# Patient Record
Sex: Male | Born: 1990 | Race: Black or African American | Hispanic: No | Marital: Married | Smoking: Current every day smoker
Health system: Southern US, Community
[De-identification: ages and names within clinical notes are randomized; demographics above are authoritative.]

## PROBLEM LIST (undated history)

## (undated) DIAGNOSIS — L0591 Pilonidal cyst without abscess: Secondary | ICD-10-CM

## (undated) HISTORY — PX: OTHER SURGICAL HISTORY: SHX169

---

## 2010-03-02 ENCOUNTER — Emergency Department (HOSPITAL_COMMUNITY)
Admission: EM | Admit: 2010-03-02 | Discharge: 2010-03-02 | Payer: Self-pay | Source: Home / Self Care | Admitting: Emergency Medicine

## 2010-03-12 ENCOUNTER — Emergency Department (HOSPITAL_COMMUNITY): Payer: Self-pay

## 2010-03-12 ENCOUNTER — Emergency Department (HOSPITAL_COMMUNITY)
Admission: EM | Admit: 2010-03-12 | Discharge: 2010-03-12 | Disposition: A | Discharge status: Home / Self Care | Payer: No Typology Code available for payment source | Attending: Emergency Medicine | Admitting: Emergency Medicine

## 2010-03-12 DIAGNOSIS — S12600A Unspecified displaced fracture of seventh cervical vertebra, initial encounter for closed fracture: Secondary | ICD-10-CM | POA: Insufficient documentation

## 2010-03-12 DIAGNOSIS — M542 Cervicalgia: Secondary | ICD-10-CM | POA: Insufficient documentation
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
## 2014-02-06 ENCOUNTER — Encounter (HOSPITAL_BASED_OUTPATIENT_CLINIC_OR_DEPARTMENT_OTHER): Payer: Self-pay | Admitting: *Deleted

## 2014-02-06 ENCOUNTER — Emergency Department (HOSPITAL_BASED_OUTPATIENT_CLINIC_OR_DEPARTMENT_OTHER)
Admission: EM | Admit: 2014-02-06 | Discharge: 2014-02-06 | Disposition: A | Discharge status: Home / Self Care | Payer: Self-pay | Attending: Emergency Medicine | Admitting: Emergency Medicine

## 2014-02-06 ENCOUNTER — Emergency Department (HOSPITAL_BASED_OUTPATIENT_CLINIC_OR_DEPARTMENT_OTHER): Payer: Self-pay

## 2014-02-06 DIAGNOSIS — R05 Cough: Secondary | ICD-10-CM

## 2014-02-06 DIAGNOSIS — J40 Bronchitis, not specified as acute or chronic: Secondary | ICD-10-CM

## 2014-02-06 DIAGNOSIS — J209 Acute bronchitis, unspecified: Secondary | ICD-10-CM | POA: Insufficient documentation

## 2014-02-06 DIAGNOSIS — R059 Cough, unspecified: Secondary | ICD-10-CM

## 2014-02-06 DIAGNOSIS — Z720 Tobacco use: Secondary | ICD-10-CM | POA: Insufficient documentation

## 2014-02-06 MED ORDER — AZITHROMYCIN 250 MG PO TABS: 250.0000 mg | ORAL_TABLET | Freq: Every day | ORAL | Status: DC

## 2014-02-06 MED ORDER — AZITHROMYCIN 250 MG PO TABS
500.0000 mg | ORAL_TABLET | Freq: Once | ORAL | Status: AC
  Administered 2014-02-06: 500 mg via ORAL
  Filled 2014-02-06: qty 2

## 2014-02-06 NOTE — ED Notes (Addendum)
Congestion, headache, cough, facial pain. States that his grandmother and mother have the same.Marland Kitchen

## 2014-02-06 NOTE — ED Provider Notes (Signed)
CSN: [id number]     Arrival date & time 02/06/14  1533 History   This chart was scribed for Nelia Shi, MD by Milly Jakob, ED Scribe. The patient was seen in room MH12/MH12. Patient's care was started at 4:27 PM.     Chief Complaint  Patient presents with  . URI   The history is provided by the patient. No language interpreter was used.   HPI Comments: Jon Gregory is a 24 y.o. male who presents to the Emergency Department complaining of constant URI symptoms for the past 6 days. He reports a persistent, productive cough, subjective fever, headache, congestion, and facial pain. He is a current every day smoker, but he has not been smoking while he has been sick.  He reports spending time with his mother and grandmother who have been sick with similar symptoms. He denies any additional medical problems.   History reviewed. No pertinent past medical history. History reviewed. No pertinent past surgical history. No family history on file. History  Substance Use Topics  . Smoking status: Current Every Day Smoker  . Smokeless tobacco: Not on file  . Alcohol Use: Not on file    Review of Systems  Constitutional: Positive for fever (subjective) and chills.  Respiratory: Positive for cough.   Gastrointestinal: Negative for nausea, vomiting and diarrhea.  Neurological: Positive for headaches.  All other systems reviewed and are negative.  Allergies  Review of patient's allergies indicates no known allergies.  Home Medications   Prior to Admission medications   Medication Sig Start Date End Date Taking? Authorizing Provider  azithromycin (ZITHROMAX Z-PAK) 250 MG tablet Take 1 tablet (250 mg total) by mouth daily. 02/06/14   Nelia Shi, MD   BP 158/87 mmHg  Pulse 84  Temp(Src) 98.5 F (36.9 C) (Oral)  Resp 20  Ht  (1.727 m)  Wt 215 lb (97.523 kg)  BMI 32.70 kg/m2  SpO2 100% Physical Exam Physical Exam  Nursing note and vitals reviewed. Constitutional: He is  oriented to person, place, and time. He appears well-developed and well-nourished. No distress.  HENT:  Head: Normocephalic and atraumatic.  Eyes: Pupils are equal, round, and reactive to light.  Neck: Normal range of motion.  Cardiovascular: Normal rate and intact distal pulses.   Pulmonary/Chest: No respiratory distress.  no wheezes.  No use of accessory muscles. Abdominal: Normal appearance. He exhibits no distension.  Musculoskeletal: Normal range of motion.  Neurological: He is alert and oriented to person, place, and time. No cranial nerve deficit.  Skin: Skin is warm and dry. No rash noted.  Psychiatric: He has a normal mood and affect. His behavior is normal.   ED Course  Procedures (including critical care time) Medications  azithromycin (ZITHROMAX) tablet 500 mg (not administered)    DIAGNOSTIC STUDIES: Oxygen Saturation is 100% on room air, normal by my interpretation.    COORDINATION OF CARE: 4:31 PM-Discussed treatment plan which includes CXR with pt at bedside and pt agreed to plan.   Labs Review Labs Reviewed - No data to display  Imaging Review Dg Chest 2 View  02/06/2014   CLINICAL DATA:  Five-day history of cough and congestion.  EXAM: CHEST  2 VIEW  COMPARISON:  None.  FINDINGS: The cardiac silhouette, mediastinal and hilar contours are normal. There is peribronchial thickening and increased interstitial markings which could reflect smoking changes or bronchitis. No infiltrates or effusions. The bony thorax is intact.  IMPRESSION: Bronchitic lung changes could be related to  smoking or bronchitis. No infiltrates.   Electronically Signed   By: Loralie Champagne M.D.   On: 02/06/2014 16:41      MDM   Final diagnoses:  Cough  Bronchitis     I personally performed the services described in this documentation, which was scribed in my presence. The recorded information has been reviewed and considered.      Nelia Shi, MD 02/06/14 [phone]

## 2014-02-06 NOTE — Discharge Instructions (Signed)

## 2014-06-08 ENCOUNTER — Emergency Department (HOSPITAL_BASED_OUTPATIENT_CLINIC_OR_DEPARTMENT_OTHER)
Admission: EM | Admit: 2014-06-08 | Discharge: 2014-06-08 | Disposition: A | Discharge status: Home / Self Care | Payer: Self-pay | Attending: Emergency Medicine | Admitting: Emergency Medicine

## 2014-06-08 ENCOUNTER — Encounter (HOSPITAL_BASED_OUTPATIENT_CLINIC_OR_DEPARTMENT_OTHER): Payer: Self-pay | Admitting: *Deleted

## 2014-06-08 DIAGNOSIS — Z720 Tobacco use: Secondary | ICD-10-CM | POA: Insufficient documentation

## 2014-06-08 DIAGNOSIS — L0501 Pilonidal cyst with abscess: Secondary | ICD-10-CM | POA: Insufficient documentation

## 2014-06-08 LAB — CBG MONITORING, ED: GLUCOSE-CAPILLARY: 87 mg/dL (ref 70–99)

## 2014-06-08 MED ORDER — CEPHALEXIN 500 MG PO CAPS: 500.0000 mg | ORAL_CAPSULE | Freq: Four times a day (QID) | ORAL | Status: DC

## 2014-06-08 MED ORDER — HYDROCODONE-ACETAMINOPHEN 5-325 MG PO TABS: 1.0000 | ORAL_TABLET | ORAL | Status: DC | PRN

## 2014-06-08 MED ORDER — LIDOCAINE-EPINEPHRINE (PF) 2 %-1:200000 IJ SOLN
10.0000 mL | Freq: Once | INTRAMUSCULAR | Status: AC
  Administered 2014-06-08: 10 mL
  Filled 2014-06-08: qty 10

## 2014-06-08 NOTE — Discharge Instructions (Signed)
Incision and Drainage Incision and drainage is a procedure in which a sac-like structure (cystic structure) is opened and drained. The area to be drained usually contains material such as pus, fluid, or blood.  LET YOUR CAREGIVER KNOW ABOUT:   Allergies to medicine.  Medicines taken, including vitamins, herbs, eyedrops, over-the-counter medicines, and creams.  Use of steroids (by mouth or creams).  Previous problems with anesthetics or numbing medicines.  History of bleeding problems or blood clots.  Previous surgery.  Other health problems, including diabetes and kidney problems.  Possibility of pregnancy, if this applies. RISKS AND COMPLICATIONS  Pain.  Bleeding.  Scarring.  Infection. BEFORE THE PROCEDURE  You may need to have an ultrasound or other imaging tests to see how large or deep your cystic structure is. Blood tests may also be used to determine if you have an infection or how severe the infection is. You may need to have a tetanus shot. PROCEDURE  The affected area is cleaned with a cleaning fluid. The cyst area will then be numbed with a medicine (local anesthetic). A small incision will be made in the cystic structure. A syringe or catheter may be used to drain the contents of the cystic structure, or the contents may be squeezed out. The area will then be flushed with a cleansing solution. After cleansing the area, it is often gently packed with a gauze or another wound dressing. Once it is packed, it will be covered with gauze and tape or some other type of wound dressing. AFTER THE PROCEDURE   Often, you will be allowed to go home right after the procedure.  You may be given antibiotic medicine to prevent or heal an infection.  If the area was packed with gauze or some other wound dressing, you will likely need to come back in 1 to 2 days to get it removed.  The area should heal in about 14 days. Document Released: 07/17/2000 Document Revised: 07/23/2011  Document Reviewed: 03/18/2011 [facility] CenterExitCare Patient Information 2015 [place]Exit[place], [place]. This information is not intended to replace advice given to you by your health care provider. Make sure you discuss any questions you have with your health care provider.  Abscess An abscess is an infected area that contains a collection of pus and debris.It can occur in almost any part of the body. An abscess is also known as a furuncle or boil. CAUSES  An abscess occurs when tissue gets infected. This can occur from blockage of oil or sweat glands, infection of hair follicles, or a minor injury to the skin. As the body tries to fight the infection, pus collects in the area and creates pressure under the skin. This pressure causes pain. People with weakened immune systems have difficulty fighting infections and get certain abscesses more often.  SYMPTOMS Usually an abscess develops on the skin and becomes a painful mass that is red, warm, and tender. If the abscess forms under the skin, you may feel a moveable soft area under the skin. Some abscesses break open (rupture) on their own, but most will continue to get worse without care. The infection can spread deeper into the body and eventually into the bloodstream, causing you to feel ill.  DIAGNOSIS  Your caregiver will take your medical history and perform a physical exam. A sample of fluid may also be taken from the abscess to determine what is causing your infection. TREATMENT  Your caregiver may prescribe antibiotic medicines to fight the infection. However, taking antibiotics alone usually  does not cure an abscess. Your caregiver may need to make a small cut (incision) in the abscess to drain the pus. In some cases, gauze is packed into the abscess to reduce pain and to continue draining the area. HOME CARE INSTRUCTIONS   Only take over-the-counter or prescription medicines for pain, discomfort, or fever as directed by your caregiver.  If you were prescribed  antibiotics, take them as directed. Finish them even if you start to feel better.  If gauze is used, follow your caregiver's directions for changing the gauze.  To avoid spreading the infection:  Keep your draining abscess covered with a bandage.  Wash your hands well.  Do not share personal care items, towels, or whirlpools with others.  Avoid skin contact with others.  Keep your skin and clothes clean around the abscess.  Keep all follow-up appointments as directed by your caregiver. SEEK MEDICAL CARE IF:   You have increased pain, swelling, redness, fluid drainage, or bleeding.  You have muscle aches, chills, or a general ill feeling.  You have a fever. MAKE SURE YOU:   Understand these instructions.  Will watch your condition.  Will get help right away if you are not doing well or get worse. Document Released: 10/31/2004 Document Revised: 07/23/2011 Document Reviewed: 04/05/2011 [facility] LLCExitCare Patient Information 2015 [place]ExitCare, [place]LL[place]. This information is not intended to replace advice given to you by your health care provider. Make sure you discuss any questions you have with your health care provider.  Pilonidal Cyst A pilonidal cyst occurs when hairs get trapped (ingrown) beneath the skin in the crease between the buttocks over your sacrum (the bone under that crease). Pilonidal cysts are most common in young men with a lot of body hair. When the cyst is ruptured (breaks) or leaking, fluid from the cyst may cause burning and itching. If the cyst becomes infected, it causes a painful swelling filled with pus (abscess). The pus and trapped hairs need to be removed (often by lancing) so that the infection can heal. However, recurrence is common and an operation may be needed to remove the cyst. HOME CARE INSTRUCTIONS   If the cyst was NOT INFECTED:  Keep the area clean and dry. Bathe or shower daily. Wash the area well with a germ-killing soap. Warm tub baths may help prevent  infection and help with drainage. Dry the area well with a towel.  Avoid tight clothing to keep area as moisture free as possible.  Keep area between buttocks as free of hair as possible. A depilatory may be used.  If the cyst WAS INFECTED and needed to be drained:  Your caregiver packed the wound with gauze to keep the wound open. This allows the wound to heal from the inside outwards and continue draining.  Return for a wound check in 1 day or as suggested.  If you take tub baths or showers, repack the wound with gauze following them. Sponge baths (at the sink) are a good alternative.  If an antibiotic was ordered to fight the infection, take as directed.  Only take over-the-counter or prescription medicines for pain, discomfort, or fever as directed by your caregiver.  After the drain is removed, use sitz baths for 20 minutes 4 times per day. Clean the wound gently with mild unscented soap, pat dry, and then apply a dry dressing. SEEK MEDICAL CARE IF:   You have increased pain, swelling, redness, drainage, or bleeding from the area.  You have a fever.  You have  muscles aches, dizziness, or a general ill feeling. Document Released: 01/19/2000 Document Revised: 04/15/2011 Document Reviewed: 03/18/2008 [facility] CenterExitCare Patient Information 2015 [place]ExitCare, [place]LLC. This information is not intended to replace advice given to you by your health care provider. Make sure you discuss any questions you have with your health care provider.

## 2014-06-08 NOTE — ED Notes (Signed)
Pt c/o abscess to sacral area x 3 days

## 2014-06-08 NOTE — ED Provider Notes (Signed)
CSN: [id number]     Arrival date & time 06/08/14  1734 History   First MD Initiated Contact with Patient 06/08/14 1844     Chief Complaint  Patient presents with  . Abscess     (Consider location/radiation/quality/duration/timing/severity/associated sxs/prior Treatment) HPI    PCP: No primary care provider on file. Blood pressure 140/74, pulse 90, temperature 99.8 F (37.7 C), temperature source Oral, resp. rate 16, height 5\' 8"  (1.727 m), weight 220 lb (99.791 kg), SpO2 100 %.  Jon Gregory is a 24 y.o.male with a significant PMH of no PMH presents to the ER with complaints of abscess to upper gluteal fold. Patient says symptoms started 3 days ago with pain and then he noticed redness and swelling to the area. He reports trying to soak it and having his wife attempt to pop it. He feels like this made it worse. He has not had any drainage from the area. He denies any nausea, vomiting, diarrhea, fevers, dysuria, abdominal pain, neck pain, weakness.    History reviewed. No pertinent past medical history. History reviewed. No pertinent past surgical history. History reviewed. No pertinent family history. History  Substance Use Topics  . Smoking status: Current Every Day Smoker -- 1.00 packs/day    Types: Cigarettes  . Smokeless tobacco: Not on file  . Alcohol Use: No    Review of Systems  10 Systems reviewed and are negative for acute change except as noted in the HPI.    Allergies  Review of patient's allergies indicates no known allergies.  Home Medications   Prior to Admission medications   Medication Sig Start Date End Date Taking? Authorizing Provider  cephALEXin (KEFLEX) 500 MG capsule Take 1 capsule (500 mg total) by mouth 4 (four) times daily. 06/08/14   Dayle Sherpa Neva SeatGreene, PA-C   BP 140/74 mmHg  Pulse 90  Temp(Src) 99.8 F (37.7 C) (Oral)  Resp 16  Ht 5\' 8"  (1.727 m)  Wt 220 lb (99.791 kg)  BMI 33.46 kg/m2  SpO2 100% Physical Exam  Constitutional: He appears  well-developed and well-nourished. No distress.  HENT:  Head: Normocephalic and atraumatic.  Eyes: Pupils are equal, round, and reactive to light.  Neck: Normal range of motion. Neck supple.  Cardiovascular: Normal rate and regular rhythm.   Pulmonary/Chest: Effort normal.  Abdominal: Soft. Bowel sounds are normal. There is no tenderness. There is no rigidity, no rebound and no guarding.  Genitourinary: Rectum normal.     Large pilonidal cyst to upper gluteal fold. It is tender with surrounded erythema and induration. This does not involve the rectum, perineum or testicles.  Neurological: He is alert.  Skin: Skin is warm and dry.  Nursing note and vitals reviewed.   ED Course  Procedures (including critical care time) Labs Review Labs Reviewed  CBG MONITORING, ED    Imaging Review No results found.   EKG Interpretation None      MDM   Final diagnoses:  Pilonidal cyst with abscess    INCISION AND DRAINAGE Performed by: Dorthula MatasGREENE,Maddox Bratcher G Consent: Verbal consent obtained. Risks and benefits: risks, benefits and alternatives were discussed Type: abscess  Body area: pilonidal cyst  Anesthesia: local infiltration  Incision was made with a scalpel.  Local anesthetic: lidocaine 2% with epinephrine  Anesthetic total: 2 ml  Complexity: complex Blunt dissection to break up loculations  Drainage: purulent  Drainage amount: large  Packing material: 1/4 in iodoform gauze  Patient tolerance: Patient tolerated the procedure well with no immediate complications.  Pt advised to return in two days to have packing removed and wound re-evaluated Will start on keflex and Rx pain medication. Will need work note. No systemic symptoms of F/n/v/d or abdominal pains.  24 y.o.Jon Gregory's evaluation in the Emergency Department is complete. It has been determined that no acute conditions requiring further emergency intervention are present at this time. The  patient/guardian have been advised of the diagnosis and plan. We have discussed signs and symptoms that warrant return to the ED, such as changes or worsening in symptoms.  Vital signs are stable at discharge. Filed Vitals:   06/08/14 1745  BP: 140/74  Pulse: 90  Temp: 99.8 F (37.7 C)  Resp: 16    Patient/guardian has voiced understanding and agreed to follow-up with the PCP or specialist.    Marlon Peliffany Karey Stucki, PA-C 06/08/14 2030  Jerelyn ScottMartha Linker, MD 06/08/14 2032

## 2014-06-08 NOTE — ED Notes (Signed)
rx x 12 given for hydrocodone and keflex- d/c with ride

## 2014-06-08 NOTE — ED Notes (Signed)
PA at bedside.

## 2014-06-10 ENCOUNTER — Emergency Department (HOSPITAL_BASED_OUTPATIENT_CLINIC_OR_DEPARTMENT_OTHER)
Admission: EM | Admit: 2014-06-10 | Discharge: 2014-06-10 | Disposition: A | Discharge status: Home / Self Care | Payer: Self-pay | Attending: Emergency Medicine | Admitting: Emergency Medicine

## 2014-06-10 ENCOUNTER — Encounter (HOSPITAL_BASED_OUTPATIENT_CLINIC_OR_DEPARTMENT_OTHER): Payer: Self-pay | Admitting: Emergency Medicine

## 2014-06-10 DIAGNOSIS — Z4801 Encounter for change or removal of surgical wound dressing: Secondary | ICD-10-CM | POA: Insufficient documentation

## 2014-06-10 DIAGNOSIS — Z09 Encounter for follow-up examination after completed treatment for conditions other than malignant neoplasm: Secondary | ICD-10-CM

## 2014-06-10 DIAGNOSIS — Z872 Personal history of diseases of the skin and subcutaneous tissue: Secondary | ICD-10-CM | POA: Insufficient documentation

## 2014-06-10 DIAGNOSIS — Z792 Long term (current) use of antibiotics: Secondary | ICD-10-CM | POA: Insufficient documentation

## 2014-06-10 DIAGNOSIS — Z720 Tobacco use: Secondary | ICD-10-CM | POA: Insufficient documentation

## 2014-06-10 HISTORY — DX: Pilonidal cyst without abscess: L05.91

## 2014-06-10 NOTE — ED Notes (Signed)
I applied 2x2's folded and placed onto surface of wound, then applied 4x4's and covered with Medipore cloth tape. I showed patient how to change same, and gave him additional wound care supplies for home use.

## 2014-06-10 NOTE — Discharge Instructions (Signed)
Sitz baths several times daily for the next 2-3 days.  Perform dressing changes with clean gauze and bacitracin several times daily.  Return to the emergency department for worsening pain, increased drainage, bleeding, or other new and concerning symptoms.   Incision Care  An incision is a surgical cut to open your skin. You need to take care of your incision. This helps you to not get an infection. HOME CARE  Only take medicine as told by your doctor.  Do not take off your bandage (dressing) or get your incision wet until your doctor approves. Change the bandage and call your doctor if the bandage gets wet, dirty, or starts to smell.  Take showers. Do not take baths, swim, or do anything that may soak your incision until it heals.  Return to your normal diet and activities as told or allowed by your doctor.  Avoid lifting any weight until your doctor approves.  Put medicine that helps lessen itching on your incision as told by your doctor. Do not pick or scratch at your incision.  Keep your doctor visit to have your stitches (sutures) or staples removed.  Drink enough fluids to keep your pee (urine) clear or pale yellow. GET HELP RIGHT AWAY IF:  You have a fever.  You have a rash.  You are dizzy, or you pass out (faint) while standing.  You have trouble breathing.  You have a reaction or side effects to medicine given to you.  You have redness, puffiness (swelling), or more pain in the incision and medicine does not help.  You have fluid, blood, or yellowish-white fluid (pus) coming from the incision lasting over 1 day.  You have muscle aches, chills, or you feel sick.  You have a bad smell coming from the incision or bandage.  Your incision opens up after stitches, staples, or sticky strips have been removed.  You keep feeling sick to your stomach (nauseous) or keep throwing up (vomiting). MAKE SURE YOU:   Understand these instructions.  Will watch your  condition.  Will get help right away if you are not doing well or get worse. Document Released: 04/15/2011 Document Reviewed: 03/17/2013 [facility] CenterExitCare Patient Information 2015 [place]villeExitCare, MarylandLLC. This information is not intended to replace advice given to you by your health care provider. Make sure you discuss any questions you have with your health care provider.

## 2014-06-10 NOTE — ED Provider Notes (Signed)
CSN: [id number]     Arrival date & time 06/10/14  2200 History  This chart was scribed for Geoffery Lyonsouglas Thatcher Doberstein, MD by Chestine SporeSoijett Blue, ED Scribe. The patient was seen in room MH09/MH09 at 11:14 PM.     Chief Complaint  Patient presents with  . Wound Check      Patient is a 24 y.o. male presenting with wound check. The history is provided by the patient. No language interpreter was used.  Wound Check This is a new problem. The current episode started 2 days ago.    HPI Comments: Jon Gregory is a 24 y.o. male who presents to the Emergency Department complaining of wound check onset today. Pt had an abscess I&D and he had it stuffed with gauze. Pt was informed to come back if the area was feeling worse. Pt notes that the abscess is at the small of his back and he still had packing placed. Pt had to change his packing several times a day because it would become soaked. This is the pt first time having an abscess. Pt was given Keflex for his treatment. He denies any other symptoms.   Past Medical History  Diagnosis Date  . Pilonidal cyst    Past Surgical History  Procedure Laterality Date  . I&d pilonidal cyst     No family history on file. History  Substance Use Topics  . Smoking status: Current Every Day Smoker -- 1.00 packs/day    Types: Cigarettes  . Smokeless tobacco: Not on file  . Alcohol Use: No    Review of Systems  Constitutional: Negative for fever and chills.  Genitourinary: Negative for dysuria and hematuria.  Musculoskeletal: Negative for myalgias, joint swelling and arthralgias.  Skin: Positive for wound. Negative for color change and rash.    A complete 10 system review of systems was obtained and all systems are negative except as noted in the HPI and PMH.    Allergies  Review of patient's allergies indicates no known allergies.  Home Medications   Prior to Admission medications   Medication Sig Start Date End Date Taking? Authorizing Provider  cephALEXin  (KEFLEX) 500 MG capsule Take 1 capsule (500 mg total) by mouth 4 (four) times daily. 06/08/14   Marlon Peliffany Greene, PA-C  HYDROcodone-acetaminophen (NORCO/VICODIN) 5-325 MG per tablet Take 1 tablet by mouth every 4 (four) hours as needed. 06/08/14   Tiffany Neva SeatGreene, PA-C   BP 158/80 mmHg  Pulse 99  Temp(Src) 98.3 F (36.8 C) (Oral)  Resp 20  Ht 5\' 8"  (1.727 m)  Wt 220 lb (99.791 kg)  BMI 33.46 kg/m2  SpO2 100%  Physical Exam  Constitutional: He is oriented to person, place, and time. He appears well-developed and well-nourished. No distress.  HENT:  Head: Normocephalic and atraumatic.  Mouth/Throat: No oropharyngeal exudate.  Eyes: Pupils are equal, round, and reactive to light.  Neck: Normal range of motion. Neck supple.  Cardiovascular: Normal rate, regular rhythm and normal heart sounds.  Exam reveals no gallop and no friction rub.   No murmur heard. Pulmonary/Chest: Effort normal and breath sounds normal. No respiratory distress. He has no wheezes. He has no rales.  Abdominal: Soft. Bowel sounds are normal. He exhibits no distension and no mass. There is no tenderness. There is no rebound and no guarding.  Musculoskeletal: Normal range of motion. He exhibits no edema or tenderness.  Neurological: He is alert and oriented to person, place, and time.  Skin: Skin is warm and dry.  Recently drained  pilonidal cyst with packing in place. No significant erythema or warmth. Packing removed with minimal residual drainage.   Psychiatric: He has a normal mood and affect.    ED Course  Procedures (including critical care time) DIAGNOSTIC STUDIES: Oxygen Saturation is 100% on RA, nl by my interpretation.    COORDINATION OF CARE: 11:18 PM-Discussed treatment plan which includes sitz bath with pt at bedside and pt agreed to plan.   Labs Review Labs Reviewed - No data to display  Imaging Review No results found.   EKG Interpretation None      MDM   Final diagnoses:  None    Wound  inspected. Packing removed. Drainage is minimal and the wound appears to be healing properly. Will recommend sitz baths, continued wound care, and when necessary return.  I personally performed the services described in this documentation, which was scribed in my presence. The recorded information has been reviewed and is accurate.      Geoffery Lyonsouglas Ibrohim Simmers, MD 06/11/14 [phone]

## 2014-06-10 NOTE — ED Notes (Signed)
Recheck of abscess 

## 2014-10-07 ENCOUNTER — Encounter (HOSPITAL_BASED_OUTPATIENT_CLINIC_OR_DEPARTMENT_OTHER): Payer: Self-pay | Admitting: Emergency Medicine

## 2014-10-07 ENCOUNTER — Emergency Department (HOSPITAL_BASED_OUTPATIENT_CLINIC_OR_DEPARTMENT_OTHER)
Admission: EM | Admit: 2014-10-07 | Discharge: 2014-10-07 | Disposition: A | Discharge status: Home / Self Care | Payer: Self-pay | Attending: Emergency Medicine | Admitting: Emergency Medicine

## 2014-10-07 DIAGNOSIS — Z792 Long term (current) use of antibiotics: Secondary | ICD-10-CM | POA: Insufficient documentation

## 2014-10-07 DIAGNOSIS — Z720 Tobacco use: Secondary | ICD-10-CM | POA: Insufficient documentation

## 2014-10-07 DIAGNOSIS — L0501 Pilonidal cyst with abscess: Secondary | ICD-10-CM

## 2014-10-07 MED ORDER — CEPHALEXIN 500 MG PO CAPS: 500.0000 mg | ORAL_CAPSULE | Freq: Four times a day (QID) | ORAL | Status: DC

## 2014-10-07 MED ORDER — LIDOCAINE-EPINEPHRINE (PF) 2 %-1:200000 IJ SOLN
20.0000 mL | Freq: Once | INTRAMUSCULAR | Status: DC
  Filled 2014-10-07: qty 20

## 2014-10-07 MED ORDER — HYDROCODONE-ACETAMINOPHEN 5-325 MG PO TABS
1.0000 | ORAL_TABLET | Freq: Once | ORAL | Status: AC
  Administered 2014-10-07: 1 via ORAL
  Filled 2014-10-07: qty 1

## 2014-10-07 MED ORDER — HYDROCODONE-ACETAMINOPHEN 5-325 MG PO TABS: 2.0000 | ORAL_TABLET | ORAL | Status: DC | PRN

## 2014-10-07 NOTE — ED Provider Notes (Signed)
CSN: [id number]     Arrival date & time 10/07/14  1902 History   First MD Initiated Contact with Patient 10/07/14 1921     Chief Complaint  Patient presents with  . Recurrent Skin Infections     (Consider location/radiation/quality/duration/timing/severity/associated sxs/prior Treatment) HPI Comments: Jon Gregory is a 24 y.o m with a pmhx of recurrent pilonidal cyst who presents to the Emergency Department today c/o abscess on bottom. Pt states that he has "had it drained before and I think I need it again." Pt states that he has 9/10 pain. Denies fever, chills, n/v/d, numbness/tingling, CP, SOB.   The history is provided by the patient.    Past Medical History  Diagnosis Date  . Pilonidal cyst    Past Surgical History  Procedure Laterality Date  . I&d pilonidal cyst     No family history on file. Social History  Substance Use Topics  . Smoking status: Current Every Day Smoker -- 1.00 packs/day    Types: Cigarettes  . Smokeless tobacco: None  . Alcohol Use: No    Review of Systems  All other systems reviewed and are negative.     Allergies  Review of patient's allergies indicates no known allergies.  Home Medications   Prior to Admission medications   Medication Sig Start Date End Date Taking? Authorizing Provider  HYDROcodone-acetaminophen (NORCO/VICODIN) 5-325 MG per tablet Take 1 tablet by mouth every 4 (four) hours as needed. 06/08/14  Yes Tiffany Neva Seat, PA-C  cephALEXin (KEFLEX) 500 MG capsule Take 1 capsule (500 mg total) by mouth 4 (four) times daily. 06/08/14   Tiffany Neva Seat, PA-C  cephALEXin (KEFLEX) 500 MG capsule Take 1 capsule (500 mg total) by mouth 4 (four) times daily. 10/07/14   Braeden Dolinski Tripp Maayan Jenning, PA-C  HYDROcodone-acetaminophen (NORCO/VICODIN) 5-325 MG per tablet Take 2 tablets by mouth every 4 (four) hours as needed. 10/07/14   Tonye Tancredi Tripp Kaveri Perras, PA-C   BP 122/76 mmHg  Pulse 70  Temp(Src) 99.2 F (37.3 C) (Oral)  Resp 18  Ht 5\' 8"  (1.727  m)  Wt 238 lb (107.956 kg)  BMI 36.20 kg/m2  SpO2 100% Physical Exam  Constitutional: He is oriented to person, place, and time. He appears well-developed and well-nourished.  HENT:  Head: Normocephalic and atraumatic.  Eyes: Conjunctivae are normal. Right eye exhibits no discharge. Left eye exhibits no discharge. No scleral icterus.  Cardiovascular: Normal rate.   Pulmonary/Chest: Effort normal.  Abdominal: Soft.  Lymphadenopathy:    He has no cervical adenopathy.  Neurological: He is alert and oriented to person, place, and time. Coordination normal.  Skin: Skin is warm and dry. No rash noted. No pallor.  2 cm fluctuant mass at apex of gluteal crest. Indurated tissue surrounding abscess. Area not warm to touch. No erythema.   Psychiatric: He has a normal mood and affect. His behavior is normal.  Nursing note and vitals reviewed.   ED Course  Procedures (including critical care time)  INCISION AND DRAINAGE Performed by: Dub Mikes Consent: Verbal consent obtained. Risks and benefits: risks, benefits and alternatives were discussed Type: abscess  Body area: apex of gluteal cleft  Anesthesia: local infiltration  Incision was made with a scalpel.  Local anesthetic: lidocaine 2%  w/ epinephrine  Anesthetic total: 10 ml  Complexity: complex Blunt dissection to break up loculations  Drainage: purulent  Drainage amount: copious  Packing material: 1/4 in iodoform gauze  Patient tolerance: Patient tolerated the procedure well with no immediate complications.  Labs Review Labs Reviewed - No data to display  Imaging Review No results found. I have personally reviewed and evaluated these images and lab results as part of my medical decision-making.   EKG Interpretation None      MDM   Final diagnoses:  Pilonidal cyst with abscess    Pt seen for recurrent pilonidal cyst requiring incision and drainage. No evidence of cellulitis. Area was  packed. Return in 2 days for wound re-check. Will give keflex. Recommend surgery follow up for pilonidal cyst removal. Return precautions outlined in patient discharge instructions.      Lester Kinsman Murdock, PA-C 10/07/14 2302  Elwin Mocha, MD 10/07/14 [phone]

## 2014-10-07 NOTE — Discharge Instructions (Signed)
Pilonidal Cyst, Care After A pilonidal cyst occurs when hairs get trapped (ingrown) beneath the skin in the crease between the buttocks over your sacrum (the bone under that crease). Pilonidal cysts are most common in young men with a lot of body hair. When the cyst breaks(ruptured) or leaks, fluid from the cyst may cause burning and itching. If the cyst becomes infected, it causes a painful swelling filled with pus (abscess). The pus and trapped hairs need to be removed (often by lancing) so that the infection can heal. The word pilonidal means hair nest. HOME CARE INSTRUCTIONS If the pilonidal sinus was NOT DRAINING OR LANCED:  Keep the area clean and dry. Bathe or shower daily. Wash the area well with a germ-killing soap. Hot tub baths may help prevent infection. Dry the area well with a towel.  Avoid tight clothing in order to keep area as moisture-free as possible.  Keep area between buttocks as free from hair as possible. A depilatory may be used.  Take antibiotics as directed.  Only take over-the-counter or prescription medicines for pain, discomfort, or fever as directed by your caregiver. If the cyst WAS INFECTED AND NEEDED TO BE DRAINED:  Your caregiver may have packed the wound with gauze to keep the wound open. This allows the wound to heal from the inside outward and continue to drain.  Return as directed for a wound check.  If you take tub baths or showers, repack the wound with gauze as directed following. Sponge baths are a good alternative. Sitz baths may be used three to four times a day or as directed.  If an antibiotic was ordered to fight the infection, take as directed.  Only take over-the-counter or prescription medicines for pain, discomfort, or fever as directed by your caregiver.  If a drain was in place and removed, use sitz baths for 20 minutes 4 times per day. Clean the wound gently with mild unscented soap, pat dry, and then apply a dry dressing as  directed. If you had surgery and IT WAS MARSUPIALIZED (LEFT OPEN):  Your wound was packed with gauze to keep the wound open. This allows the wound to heal from the inside outwards and continue draining. The changing of the dressing regularly also helps keep the wound clean.  Return as directed for a wound check.  If you take tub baths or showers, repack the wound with gauze as directed following. Sponge baths are a good alternative. Sitz baths can also be used. This may be done three to four times a day or as directed.  If an antibiotic was ordered to fight the infection, take as directed.  Only take over-the-counter or prescription medicines for pain, discomfort, or fever as directed by your caregiver.  If you had surgery and the wound was closed you may care for it as directed. This generally includes keeping it dry and clean and dressing it as directed. SEEK MEDICAL CARE IF:   You have increased pain, swelling, redness, drainage, or bleeding from the area.  You have a fever.  You have muscles aches, dizziness, or a general ill feeling. Document Released: 02/21/2006 Document Revised: 09/23/2012 Document Reviewed: 05/08/2006 [facility] Patient Information 2015 [place], [place]. This information is not intended to replace advice given to you by your health care provider. Make sure you discuss any questions you have with your health care provider.  Pilonidal Cyst A pilonidal cyst occurs when hairs get trapped (ingrown) beneath the skin in the crease between the buttocks over  your sacrum (the bone under that crease). Pilonidal cysts are most common in young men with a lot of body hair. When the cyst is ruptured (breaks) or leaking, fluid from the cyst may cause burning and itching. If the cyst becomes infected, it causes a painful swelling filled with pus (abscess). The pus and trapped hairs need to be removed (often by lancing) so that the infection can heal. However, recurrence is common and an  operation may be needed to remove the cyst. HOME CARE INSTRUCTIONS   If the cyst was NOT INFECTED:  Keep the area clean and dry. Bathe or shower daily. Wash the area well with a germ-killing soap. Warm tub baths may help prevent infection and help with drainage. Dry the area well with a towel.  Avoid tight clothing to keep area as moisture free as possible.  Keep area between buttocks as free of hair as possible. A depilatory may be used.  If the cyst WAS INFECTED and needed to be drained:  Your caregiver packed the wound with gauze to keep the wound open. This allows the wound to heal from the inside outwards and continue draining.  Return for a wound check in 1 day or as suggested.  If you take tub baths or showers, repack the wound with gauze following them. Sponge baths (at the sink) are a good alternative.  If an antibiotic was ordered to fight the infection, take as directed.  Only take over-the-counter or prescription medicines for pain, discomfort, or fever as directed by your caregiver.  After the drain is removed, use sitz baths for 20 minutes 4 times per day. Clean the wound gently with mild unscented soap, pat dry, and then apply a dry dressing. SEEK MEDICAL CARE IF:   You have increased pain, swelling, redness, drainage, or bleeding from the area.  You have a fever.  You have muscles aches, dizziness, or a general ill feeling. Document Released: 01/19/2000 Document Revised: 04/15/2011 Document Reviewed: 03/18/2008 [facility] Patient Information 2015 [place], [place]. This information is not intended to replace advice given to you by your health care provider. Make sure you discuss any questions you have with your health care provider.  Return in 2 days for wound check and to have packing removed. Return if fevers, chills or signs of infection occur. Follow up with surgery to have pilonidal cyst removed.

## 2014-10-07 NOTE — ED Notes (Signed)
24 yo c/o boil (cyst) in the crease of his buttocks. Reports having it drained before but states "it has never really went away." Denies drainage. Has taken a Vicodin at 1300 and ibuprofen at 1800.

## 2014-10-12 ENCOUNTER — Encounter (HOSPITAL_BASED_OUTPATIENT_CLINIC_OR_DEPARTMENT_OTHER): Payer: Self-pay | Admitting: Emergency Medicine

## 2014-10-12 ENCOUNTER — Emergency Department (HOSPITAL_BASED_OUTPATIENT_CLINIC_OR_DEPARTMENT_OTHER)
Admission: EM | Admit: 2014-10-12 | Discharge: 2014-10-12 | Disposition: A | Discharge status: Home / Self Care | Payer: Self-pay | Attending: Emergency Medicine | Admitting: Emergency Medicine

## 2014-10-12 DIAGNOSIS — Z720 Tobacco use: Secondary | ICD-10-CM | POA: Insufficient documentation

## 2014-10-12 DIAGNOSIS — Z872 Personal history of diseases of the skin and subcutaneous tissue: Secondary | ICD-10-CM | POA: Insufficient documentation

## 2014-10-12 DIAGNOSIS — Z792 Long term (current) use of antibiotics: Secondary | ICD-10-CM | POA: Insufficient documentation

## 2014-10-12 DIAGNOSIS — Z4801 Encounter for change or removal of surgical wound dressing: Secondary | ICD-10-CM | POA: Insufficient documentation

## 2014-10-12 DIAGNOSIS — Z5189 Encounter for other specified aftercare: Secondary | ICD-10-CM

## 2014-10-12 NOTE — ED Provider Notes (Signed)
CSN: [id number]     Arrival date & time 10/12/14  2041 History  This chart was scribed for Mirian Mo, MD by Tanda Rockers, ED Scribe. This patient was seen in room MH10/MH10 and the patient's care was started at 10:06 PM.  Chief Complaint  Patient presents with  . Wound Check   Patient is a 24 y.o. male presenting with wound check. The history is provided by the patient. No language interpreter was used.  Wound Check This is a new problem. The current episode started more than 2 days ago. The problem occurs rarely. The problem has been gradually improving. Pertinent negatives include no chest pain, no abdominal pain, no headaches and no shortness of breath.     HPI Comments: Jon Gregory is a 24 y.o. male who presents to the Emergency Department for wound check of pilonidal cyst. Pt was seen in ED on 10/07/2014 (5 days ago) for pilonidal cyst. He had I&D done at that time with packing. Pt notes that the packing has since fallen out. He mentions an itching sensation to the are but otherwise mentions the wound has been healing well. Pt is currently taking Keflex for the cyst. Denies fever, chills, nausea, vomiting, or any other associated symptoms.   Past Medical History  Diagnosis Date  . Pilonidal cyst    Past Surgical History  Procedure Laterality Date  . I&d pilonidal cyst     No family history on file. Social History  Substance Use Topics  . Smoking status: Current Every Day Smoker -- 1.00 packs/day    Types: Cigarettes  . Smokeless tobacco: None  . Alcohol Use: Yes     Comment: rarely    Review of Systems  Constitutional: Negative for fever and chills.  Respiratory: Negative for shortness of breath.   Cardiovascular: Negative for chest pain.  Gastrointestinal: Negative for nausea, vomiting and abdominal pain.  Skin: Positive for wound (Pilonidal cyst).  Neurological: Negative for headaches.   Allergies  Review of patient's allergies indicates no known  allergies.  Home Medications   Prior to Admission medications   Medication Sig Start Date End Date Taking? Authorizing Provider  cephALEXin (KEFLEX) 500 MG capsule Take 1 capsule (500 mg total) by mouth 4 (four) times daily. 06/08/14   Tiffany Neva Seat, PA-C  cephALEXin (KEFLEX) 500 MG capsule Take 1 capsule (500 mg total) by mouth 4 (four) times daily. 10/07/14   Samantha Tripp Dowless, PA-C  HYDROcodone-acetaminophen (NORCO/VICODIN) 5-325 MG per tablet Take 1 tablet by mouth every 4 (four) hours as needed. 06/08/14   Marlon Pel, PA-C  HYDROcodone-acetaminophen (NORCO/VICODIN) 5-325 MG per tablet Take 2 tablets by mouth every 4 (four) hours as needed. 10/07/14   Samantha Tripp Dowless, PA-C   Triage Vitals: BP 124/77 mmHg  Pulse 72  Temp(Src) 98.2 F (36.8 C) (Oral)  Resp 16  Ht 5\' 7"  (1.702 m)  Wt 230 lb (104.327 kg)  BMI 36.01 kg/m2  SpO2 99%   Physical Exam  Constitutional: He is oriented to person, place, and time. He appears well-developed and well-nourished.  HENT:  Head: Normocephalic and atraumatic.  Eyes: Conjunctivae and EOM are normal.  Neck: Normal range of motion. Neck supple.  Cardiovascular: Normal rate, regular rhythm and normal heart sounds.   Pulmonary/Chest: Effort normal and breath sounds normal. No respiratory distress.  Abdominal: He exhibits no distension. There is no tenderness. There is no rebound and no guarding.  Musculoskeletal: Normal range of motion.  Neurological: He is alert and oriented to person,  place, and time.  Skin: Skin is warm and dry.  Open and still draining pilonidal incision, no surrounding erythema, drainage serous/mildly purulent  Vitals reviewed.   ED Course  Procedures (including critical care time)  DIAGNOSTIC STUDIES: Oxygen Saturation is 99% on RA, normal by my interpretation.    COORDINATION OF CARE: 10:08 PM-Discussed treatment plan which includes continuing antibiotic use with pt at bedside and pt agreed to plan.   Labs  Review Labs Reviewed - No data to display  Imaging Review No results found. I have personally reviewed and evaluated these images and lab results as part of my medical decision-making.   EKG Interpretation None      MDM   Final diagnoses:  Wound check, abscess    24 y.o. male with pertinent PMH of recent visit for incision of pilonidal cyst presents for wound check. Wound well appearing, no signs of outlying infection. Packing fell out before arrival.  Patient compliant with therapy. Discharged home in stable condition..    I have reviewed all laboratory and imaging studies if ordered as above  1. Wound check, abscess           Mirian Mo, MD 10/13/14 [phone]

## 2014-10-12 NOTE — ED Notes (Signed)
Pt verbalizes understanding of d/c instructions and denies any further need at this time. 

## 2014-10-12 NOTE — ED Notes (Signed)
Recheck of abscess on low back.

## 2014-10-12 NOTE — Discharge Instructions (Signed)

## 2015-01-10 ENCOUNTER — Emergency Department (HOSPITAL_BASED_OUTPATIENT_CLINIC_OR_DEPARTMENT_OTHER)
Admission: EM | Admit: 2015-01-10 | Discharge: 2015-01-10 | Disposition: A | Discharge status: Home / Self Care | Payer: Self-pay | Attending: Emergency Medicine | Admitting: Emergency Medicine

## 2015-01-10 ENCOUNTER — Encounter (HOSPITAL_BASED_OUTPATIENT_CLINIC_OR_DEPARTMENT_OTHER): Payer: Self-pay | Admitting: *Deleted

## 2015-01-10 DIAGNOSIS — F17210 Nicotine dependence, cigarettes, uncomplicated: Secondary | ICD-10-CM | POA: Insufficient documentation

## 2015-01-10 DIAGNOSIS — Z792 Long term (current) use of antibiotics: Secondary | ICD-10-CM | POA: Insufficient documentation

## 2015-01-10 DIAGNOSIS — J020 Streptococcal pharyngitis: Secondary | ICD-10-CM | POA: Insufficient documentation

## 2015-01-10 DIAGNOSIS — Z872 Personal history of diseases of the skin and subcutaneous tissue: Secondary | ICD-10-CM | POA: Insufficient documentation

## 2015-01-10 LAB — RAPID STREP SCREEN (MED CTR MEBANE ONLY): STREPTOCOCCUS, GROUP A SCREEN (DIRECT): POSITIVE — AB

## 2015-01-10 MED ORDER — PENICILLIN G BENZATHINE 1200000 UNIT/2ML IM SUSP
1.2000 10*6.[IU] | Freq: Once | INTRAMUSCULAR | Status: AC
  Administered 2015-01-10: 1.2 10*6.[IU] via INTRAMUSCULAR
  Filled 2015-01-10: qty 2

## 2015-01-10 MED ORDER — IBUPROFEN 800 MG PO TABS
800.0000 mg | ORAL_TABLET | Freq: Once | ORAL | Status: AC
  Administered 2015-01-10: 800 mg via ORAL
  Filled 2015-01-10: qty 1

## 2015-01-10 MED ORDER — IBUPROFEN 800 MG PO TABS: 800.0000 mg | ORAL_TABLET | Freq: Three times a day (TID) | ORAL | Status: AC

## 2015-01-10 MED ORDER — DEXAMETHASONE SODIUM PHOSPHATE 10 MG/ML IJ SOLN
10.0000 mg | Freq: Once | INTRAMUSCULAR | Status: AC
  Administered 2015-01-10: 10 mg via INTRAMUSCULAR
  Filled 2015-01-10: qty 1

## 2015-01-10 NOTE — ED Notes (Signed)
Fever and sore throat x 2 days.  

## 2015-01-10 NOTE — Discharge Instructions (Signed)
1. Medications: Ibuprofen, usual home medications 2. Treatment: rest, drink plenty of fluids,  3. Follow Up: Please followup with your primary doctor in 3 days for discussion of your diagnoses and further evaluation after today's visit; if you do not have a primary care doctor use the resource guide provided to find one; Please return to the ER for  difficulty breathing or swallowing, increasing fevers or other concerns    Strep Throat Strep throat is a bacterial infection of the throat. Your health care provider may call the infection tonsillitis or pharyngitis, depending on whether there is swelling in the tonsils or at the back of the throat. Strep throat is most common during the cold months of the year in children who are 89-75 years of age, but it can happen during any season in people of any age. This infection is spread from person to person (contagious) through coughing, sneezing, or close contact. CAUSES Strep throat is caused by the bacteria called Streptococcus pyogenes. RISK FACTORS This condition is more likely to develop in:  People who spend time in crowded places where the infection can spread easily.  People who have close contact with someone who has strep throat. SYMPTOMS Symptoms of this condition include:  Fever or chills.   Redness, swelling, or pain in the tonsils or throat.  Pain or difficulty when swallowing.  White or yellow spots on the tonsils or throat.  Swollen, tender glands in the neck or under the jaw.  Red rash all over the body (rare). DIAGNOSIS This condition is diagnosed by performing a rapid strep test or by taking a swab of your throat (throat culture test). Results from a rapid strep test are usually ready in a few minutes, but throat culture test results are available after one or two days. TREATMENT This condition is treated with antibiotic medicine. HOME CARE INSTRUCTIONS Medicines  Take over-the-counter and prescription medicines only  as told by your health care provider.  Take your antibiotic as told by your health care provider. Do not stop taking the antibiotic even if you start to feel better.  Have family members who also have a sore throat or fever tested for strep throat. They may need antibiotics if they have the strep infection. Eating and Drinking  Do not share food, drinking cups, or personal items that could cause the infection to spread to other people.  If swallowing is difficult, try eating soft foods until your sore throat feels better.  Drink enough fluid to keep your urine clear or pale yellow. General Instructions  Gargle with a salt-water mixture 3-4 times per day or as needed. To make a salt-water mixture, completely dissolve -1 tsp of salt in 1 cup of warm water.  Make sure that all household members wash their hands well.  Get plenty of rest.  Stay home from school or work until you have been taking antibiotics for 24 hours.  Keep all follow-up visits as told by your health care provider. This is important. SEEK MEDICAL CARE IF:  The glands in your neck continue to get bigger.  You develop a rash, cough, or earache.  You cough up a thick liquid that is green, yellow-brown, or bloody.  You have pain or discomfort that does not get better with medicine.  Your problems seem to be getting worse rather than better.  You have a fever. SEEK IMMEDIATE MEDICAL CARE IF:  You have new symptoms, such as vomiting, severe headache, stiff or painful neck, chest pain, or  shortness of breath.  You have severe throat pain, drooling, or changes in your voice.  You have swelling of the neck, or the skin on the neck becomes red and tender.  You have signs of dehydration, such as fatigue, dry mouth, and decreased urination.  You become increasingly sleepy, or you cannot wake up completely.  Your joints become red or painful.   This information is not intended to replace advice given to you by  your health care provider. Make sure you discuss any questions you have with your health care provider.   Document Released: 01/19/2000 Document Revised: 10/12/2014 Document Reviewed: 05/16/2014 Elsevier Interactive Patient Education 2016 [employer].    Emergency Department Resource Guide 1) Find a Doctor and Pay Out of Pocket Although you won't have to find out who is covered by your insurance plan, it is a good idea to ask around and get recommendations. You will then need to call the office and see if the doctor you have chosen will accept you as a new patient and what types of options they offer for patients who are self-pay. Some doctors offer discounts or will set up payment plans for their patients who do not have insurance, but you will need to ask so you aren't surprised when you get to your appointment.  2) Contact Your Local Health Department Not all health departments have doctors that can see patients for sick visits, but many do, so it is worth a call to see if yours does. If you don't know where your local health department is, you can check in your phone book. The CDC also has a tool to help you locate your state's health department, and many state websites also have listings of all of their local health departments.  3) Find a Walk-in Clinic If your illness is not likely to be very severe or complicated, you may want to try a walk in clinic. These are popping up all over the country in pharmacies, drugstores, and shopping centers. They're usually staffed by nurse practitioners or physician assistants that have been trained to treat common illnesses and complaints. They're usually fairly quick and inexpensive. However, if you have serious medical issues or chronic medical problems, these are probably not your best option.  No Primary Care Doctor: - Call Health Connect at  [phone] - they can help you locate a primary care doctor that  accepts your insurance, provides certain  services, etc. - Physician Referral Service- [phone]  Chronic Pain Problems: Organization         Address  Phone   Notes  [facility]  [phone] Patients need to be referred by their primary care doctor.   Medication Assistance: Organization         Address  Phone   Notes  [facility] Medication [facility] [address] [place] [place], [place] [address] [phone] --Must be a resident of [facility] -- Must have NO insurance coverage whatsoever (no Medicaid/ Medicare, etc.) -- The pt. MUST have a primary care doctor that directs their care regularly and follows them in the community   [facility]  [phone]   [employer]  [phone]    Agencies that provide inexpensive medical care: Organization         Address  Phone   Notes  [facility]  [phone]   [facility]    [phone]   [facility] [address] [place], [place] [address] [phone]   [facility] [place] [address], [place] [phone]   [facility]anned Parenthood    [phone]   [facility]    [phone]   [facility]  [address], [place] Phone:  [phone], Fax:  [phone] Hours of Operation:  9 am - 6 pm, M-F.  Also accepts Medicaid/Medicare and self-pay.  [facility]  [address], [place] Phone: [phone], Fax: [phone]. Hours of Operation:  8:30 am - 5:30 pm, M-F.  Also accepts Medicaid and self-pay.  [facility] [place] [address], [place] Phone: [phone]   [facility] [address] Natasha Bence [place], [place] [phone], Ext. [phone] Mondays & Thursdays: 7-9 AM.  First 15 patients are seen on a first come, first serve basis.    Medicaid-accepting [facility] Providers:  Organization         Address  Phone   Notes  [facility] [address],  [phone] Also accepts self-pay patients.  [facility] [address] Laurell Josephs [place], [place]  [phone]   [facility] [address], [place] [phone]   [facility] [address] [place] [phone]   Renaye Rakers [address], [place]   [phone] Only accepts [place] Access [place] patients after they have their name applied to their card.   Self-Pay (no insurance) in [facility]:  Organization         Address  Phone   Notes  Sickle Cell Patients, [facility] [address] [place], [place] [phone]   [facility] [address] [place], [place] [phone]   [facility] [place]  [address], [place] [phone]   [facility]/Dr. Osei-Bonsu  [address] [place] or [address], [place] [phone] Phone number for both [place] and [place] locations is the same.  [facility] [address] [place] [phone]   [facility] [address], [place] or [address] [phone]   [facility] [address] [place] [phone], phone; [phone], fax Sees patients 1st and 3rd Saturday of every month.  Must not qualify for public or private insurance (i.e. Medicaid, Medicare, [place] Health Choice, Veterans' Benefits)  Household income should be no more than 200% of the poverty level The clinic cannot treat you if you are pregnant or think you are pregnant  Sexually transmitted diseases are not treated at the clinic.    Dental Care: Organization         Address  Phone  Notes  [facility] Department of [facility] [address] [place], [place] [phone] Accepts  children up to age 21 who are enrolled in [place] or [place] Health Choice; pregnant women with a Medicaid card; and children who have applied for Medicaid or [place] Health Choice, but were declined, whose parents can pay a reduced fee at time of service.  [facility] Department of [facility]  [address], [place] [phone] Accepts children up to age 47 who are enrolled in [place] or   Health Choice; pregnant women with a Medicaid card; and children who have applied for Medicaid or [place] Health Choice, but were declined, whose parents can pay a reduced fee at time of service.  [facility]  [address], [place] [phone]) [phone] Patients are seen by appointment only. Walk-ins are not accepted. [facility] will see patients 25 years of age and older. Monday - Tuesday (8am-5pm) Most Wednesdays (8:30-5pm) $30 per visit, cash only  [facility]  [address], [place] [phone] Patients are seen by appointment only. Walk-ins are not accepted. [facility] will see patients 25 years of age and older. One Wednesday Evening (Monthly: Volunteer Based).  $30 per visit, cash only  [employer] [facility]  [phone]) [phone] for adults; Children under age 664, call Graduate Pediatric Dentistry at [phone]. Children aged 134-14, please call [phone]) [phone] to request a pediatric application.  Dental services are provided in all areas of dental care including fillings, crowns and bridges, complete and partial dentures, implants, gum treatment, root canals, and extractions. Preventive care is also provided. Treatment is provided to both adults and children. Patients are selected via a lottery and there is often a waiting list.   [facility] [address], [place]  [phone]) [phone] [email or web address]   [facility] [address], [place], [place] [phone], Ext. [phone] Second and  Fourth Thursday of each month, opens at 6:30 AM; Clinic ends at 9 AM.  Patients are seen on a first-come first-served basis, and a limited number are seen during each clinic.   [facility]  [address], [place], [place] [phone]   Eligibility Requirements You must have lived in [place], [place], or [place] counties for at least the last three months.   You cannot be eligible for state or federal sponsored [place] Cityhealthcare insurance, including CIGNAVeterans Administration, [place]icaid, or [employer]icare.   You generally cannot be eligible for healthcare insurance through your employer.    How to apply: Eligibility screenings are held every Tuesday and Wednesday afternoon from 1:00 pm until 4:00 pm. You do not need an appointment for the interview!  [facility] [address], [place], [place] [phone]   [facility]  [phone]   [facility]  [phone]   [facility]  [phone]    Behavioral Health Resources in the Community: Intensive Outpatient Programs Organization         Address  Phone  Notes  [facility] [address], [place], [place] [phone]   [facility] [address], [place], [place] [phone]   [facility] [address], [place], [place]  [phone]   [facility] [address],  [place], [place] [phone] or [phone]   Substance Abuse Resources Organization         Address  Phone  Notes  [facility]  [phone]   [facility]  [phone]   [facility]  [phone]   [facility]  [phone]   Residential & Outpatient Substance Abuse Program  [phone]   Psychological Services Organization         Address  Phone  Notes  [facility]  [phone]- [phone]   [facility]  [phone]- [phone]   [facility]  [address], [place] [phone] or [phone]  Mobile Crisis Teams Organization         Address  Phone  Notes  [facility], Mobile Crisis Care Unit  [phone]   [facility]  [address] [place], [place] [phone]   Doristine Locks [address] [place] [phone]    Self-Help/Support Groups Organization         Address  Phone             Notes  [facility] [place] - variety of support groups  336- I7437963 Call for more information  Narcotics Anonymous (NA), [facility] [address], [employer] [place]  2 meetings at this location   Statistician         Address  Phone  Notes  [facility] [address],    [place]  [phone]   [facility]  [address] [place] [address], [place], [place] [phone]   [facility] [address] [place], [place] [phone] Admissions: 8am-3pm M-F  [facility] [address]    [place], [place] [phone]   [facility] [address] [place], [place], [place] [phone]   [facility] [address],  [place], [place] [phone]   [facility] - Intensive Outpatient [address] Laurell Josephs [address], [place], [place] [phone]   [facility] [address] [place].,  [place], [place] [phone] or [phone]   [facility] ([facility]) [address], [place], [place] [phone] Accepts Medicaid  [facility] [address],  [place] [phone] Substance Abuse/Addiction Treatment   [facility] Organization         Address  Phone  Notes  [facility]  [phone]   Angie Fava, PhD [address] Ervin Knack [place], [place]   [phone] or [phone]   [facility]   [address] [place], [place] [phone]   [facility] [address] [place], [place] [phone] Insurance/Medicaid/sponsorship through [facility] [address]                                    [place], [place] [phone] Therapy/tele-psych/case  [facility] [address], [place] [phone]    Dr. Lolly Mustache  [phone]   [facility] [place]  [employer] [facility] Dept. 1) [address] [place] 2) [address], [place] 3)  [address], [place] [phone]  [phone]   [facility] Child Abuse Hotline [phone] or [phone] (After Hours)

## 2015-01-10 NOTE — ED Provider Notes (Signed)
CSN: [id number]     Arrival date & time 01/10/15  1432 History   First MD Initiated Contact with Patient 01/10/15 1535     Chief Complaint  Patient presents with  . Fever  . Sore Throat     (Consider location/radiation/quality/duration/timing/severity/associated sxs/prior Treatment) Patient is a 24 y.o. male presenting with fever and pharyngitis. The history is provided by the patient and medical records. No language interpreter was used.  Fever Associated symptoms: sore throat   Associated symptoms: no chest pain, no cough, no diarrhea, no dysuria, no headaches, no nausea, no rash and no vomiting   Sore Throat Associated symptoms include fatigue, a fever and a sore throat. Pertinent negatives include no abdominal pain, chest pain, coughing, diaphoresis, headaches, nausea, rash or vomiting.     Tawanna SoloDominik Centner is a 24 y.o. male  with a hx of pilonidal cyst presents to the Emergency Department complaining of gradual, persistent, progressively worsening sore throat, subjective fevers, nausea onset 2 days. Patient denies vomiting, otalgia, headache, neck stiffness. He denies known sick contacts. He has not taken a flu shot this year.  Patient reports painful but no difficulty swallowing. He denies stridor or difficulty breathing. He has tried Tylenol and cold medication at home without relief. Nothing makes the symptoms better or worse.   Past Medical History  Diagnosis Date  . Pilonidal cyst    Past Surgical History  Procedure Laterality Date  . I&d pilonidal cyst     No family history on file. Social History  Substance Use Topics  . Smoking status: Current Every Day Smoker -- 1.00 packs/day    Types: Cigarettes  . Smokeless tobacco: None  . Alcohol Use: Yes     Comment: rarely    Review of Systems  Constitutional: Positive for fever and fatigue. Negative for diaphoresis, appetite change and unexpected weight change.  HENT: Positive for sore throat. Negative for mouth sores.    Eyes: Negative for visual disturbance.  Respiratory: Negative for cough, chest tightness, shortness of breath and wheezing.   Cardiovascular: Negative for chest pain.  Gastrointestinal: Negative for nausea, vomiting, abdominal pain, diarrhea and constipation.  Endocrine: Negative for polydipsia, polyphagia and polyuria.  Genitourinary: Negative for dysuria, urgency, frequency and hematuria.  Musculoskeletal: Negative for back pain and neck stiffness.  Skin: Negative for rash.  Allergic/Immunologic: Negative for immunocompromised state.  Neurological: Negative for syncope, light-headedness and headaches.  Hematological: Does not bruise/bleed easily.  Psychiatric/Behavioral: Negative for sleep disturbance. The patient is not nervous/anxious.       Allergies  Review of patient's allergies indicates no known allergies.  Home Medications   Prior to Admission medications   Medication Sig Start Date End Date Taking? Authorizing Provider  cephALEXin (KEFLEX) 500 MG capsule Take 1 capsule (500 mg total) by mouth 4 (four) times daily. 06/08/14   Tiffany Neva SeatGreene, PA-C  cephALEXin (KEFLEX) 500 MG capsule Take 1 capsule (500 mg total) by mouth 4 (four) times daily. 10/07/14   Samantha Tripp Dowless, PA-C  HYDROcodone-acetaminophen (NORCO/VICODIN) 5-325 MG per tablet Take 1 tablet by mouth every 4 (four) hours as needed. 06/08/14   Marlon Peliffany Greene, PA-C  HYDROcodone-acetaminophen (NORCO/VICODIN) 5-325 MG per tablet Take 2 tablets by mouth every 4 (four) hours as needed. 10/07/14   Samantha Tripp Dowless, PA-C  ibuprofen (ADVIL,MOTRIN) 800 MG tablet Take 1 tablet (800 mg total) by mouth 3 (three) times daily. 01/10/15   Kelda Azad, PA-C   BP 139/89 mmHg  Pulse 102  Temp(Src) 99.5 F (37.5 C) (  Oral)  Resp 18  Ht  (1.727 m)  Wt 99.791 kg  BMI 33.46 kg/m2  SpO2 100% Physical Exam  Constitutional: He appears well-developed and well-nourished. No distress.  HENT:  Head: Normocephalic and  atraumatic.  Right Ear: Tympanic membrane, external ear and ear canal normal.  Left Ear: Tympanic membrane, external ear and ear canal normal.  Nose: Nose normal. No mucosal edema or rhinorrhea.  Mouth/Throat: Uvula is midline. Mucous membranes are dry. No trismus in the jaw. No uvula swelling. Oropharyngeal exudate, posterior oropharyngeal edema and posterior oropharyngeal erythema present. No tonsillar abscesses.  Posterior oropharynx with erythema, edema and exudate on the tonsils Mucous membranes are slightly dry  Eyes: Conjunctivae are normal.  Neck: Normal range of motion, full passive range of motion without pain and phonation normal. No tracheal tenderness, no spinous process tenderness and no muscular tenderness present. No rigidity. No erythema and normal range of motion present. No Brudzinski's sign and no Kernig's sign noted.  Range of motion without pain  No midline or paraspinal tenderness Normal phonation No stridor Handling secretions without difficulty No nuchal rigidity or meningeal signs  Cardiovascular: Normal rate, regular rhythm, normal heart sounds and intact distal pulses.   Pulses:      Radial pulses are 2+ on the right side, and 2+ on the left side.  Pulmonary/Chest: Effort normal and breath sounds normal. No stridor. No respiratory distress. He has no decreased breath sounds. He has no wheezes.  Equal chest expansion, clear and equal breath sounds without focal wheezes, rhonchi or rales  Abdominal: Soft. There is no tenderness.  Soft and nontender  Musculoskeletal: Normal range of motion.  Lymphadenopathy:       Head (right side): Submandibular and tonsillar adenopathy present. No submental, no preauricular, no posterior auricular and no occipital adenopathy present.       Head (left side): Submandibular and tonsillar adenopathy present. No submental, no preauricular, no posterior auricular and no occipital adenopathy present.    He has cervical adenopathy  (Anterior, tender).       Right cervical: No superficial cervical, no deep cervical and no posterior cervical adenopathy present.      Left cervical: No superficial cervical, no deep cervical and no posterior cervical adenopathy present.  Neurological: He is alert.  Alert and oriented Moves all extremities without ataxia  Skin: Skin is warm and dry. He is not diaphoretic.  Psychiatric: He has a normal mood and affect.  Nursing note and vitals reviewed.   ED Course  Procedures (including critical care time) Labs Review Labs Reviewed  RAPID STREP SCREEN (NOT AT [facility]) - Abnormal; Notable for the following:    Streptococcus, Group A Screen (Direct) POSITIVE (*)    All other components within normal limits    Imaging Review No results found. I have personally reviewed and evaluated these images and lab results as part of my medical decision-making.   EKG Interpretation None      MDM   Final diagnoses:  Strep pharyngitis   Rogan Mentink presents with sore throat and low grade fever.  Pt febrile (low grade) with tonsillar exudate, cervical lymphadenopathy, & dysphagia.  Rapid strep test positive. Treated in the ED with steroids, NSAIDs and PCN IM.  Pt appears mildly dehydrated, discussed importance of water rehydration. Presentation non concerning for PTA or infxn spread to soft tissue. No trismus or uvula deviation. Specific return precautions discussed. Pt able to drink water in ED without difficulty with intact air way. Recommended PCP  follow up.   BP 139/89 mmHg  Pulse 102  Temp(Src) 99.5 F (37.5 C) (Oral)  Resp 18  Ht  (1.727 m)  Wt 99.791 kg  BMI 33.46 kg/m2  SpO2 100%     Dierdre Forth, PA-C 01/10/15 1728  Doug Sou, MD 01/11/15 0010

## 2015-02-15 ENCOUNTER — Encounter (HOSPITAL_COMMUNITY): Payer: Self-pay | Admitting: Emergency Medicine

## 2015-02-15 ENCOUNTER — Emergency Department (HOSPITAL_COMMUNITY): Payer: Worker's Compensation

## 2015-02-15 ENCOUNTER — Emergency Department (HOSPITAL_COMMUNITY)
Admission: EM | Admit: 2015-02-15 | Discharge: 2015-02-15 | Disposition: A | Discharge status: Home / Self Care | Payer: Worker's Compensation | Attending: Emergency Medicine | Admitting: Emergency Medicine

## 2015-02-15 DIAGNOSIS — Z791 Long term (current) use of non-steroidal anti-inflammatories (NSAID): Secondary | ICD-10-CM | POA: Insufficient documentation

## 2015-02-15 DIAGNOSIS — Y9389 Activity, other specified: Secondary | ICD-10-CM | POA: Diagnosis not present

## 2015-02-15 DIAGNOSIS — S62521B Displaced fracture of distal phalanx of right thumb, initial encounter for open fracture: Secondary | ICD-10-CM | POA: Insufficient documentation

## 2015-02-15 DIAGNOSIS — Z23 Encounter for immunization: Secondary | ICD-10-CM | POA: Insufficient documentation

## 2015-02-15 DIAGNOSIS — Z872 Personal history of diseases of the skin and subcutaneous tissue: Secondary | ICD-10-CM | POA: Diagnosis not present

## 2015-02-15 DIAGNOSIS — F17210 Nicotine dependence, cigarettes, uncomplicated: Secondary | ICD-10-CM | POA: Insufficient documentation

## 2015-02-15 DIAGNOSIS — Y990 Civilian activity done for income or pay: Secondary | ICD-10-CM | POA: Insufficient documentation

## 2015-02-15 DIAGNOSIS — W311XXA Contact with metalworking machines, initial encounter: Secondary | ICD-10-CM | POA: Diagnosis not present

## 2015-02-15 DIAGNOSIS — IMO0002 Reserved for concepts with insufficient information to code with codable children: Secondary | ICD-10-CM

## 2015-02-15 DIAGNOSIS — Y9289 Other specified places as the place of occurrence of the external cause: Secondary | ICD-10-CM | POA: Diagnosis not present

## 2015-02-15 DIAGNOSIS — S62609B Fracture of unspecified phalanx of unspecified finger, initial encounter for open fracture: Secondary | ICD-10-CM

## 2015-02-15 DIAGNOSIS — S61011A Laceration without foreign body of right thumb without damage to nail, initial encounter: Secondary | ICD-10-CM | POA: Diagnosis present

## 2015-02-15 LAB — BASIC METABOLIC PANEL
Anion gap: 11 (ref 5–15)
BUN: 5 mg/dL — ABNORMAL LOW (ref 6–20)
CALCIUM: 9.2 mg/dL (ref 8.9–10.3)
CO2: 25 mmol/L (ref 22–32)
CREATININE: 1.07 mg/dL (ref 0.61–1.24)
Chloride: 104 mmol/L (ref 101–111)
GFR calc Af Amer: >60 mL/min (ref >60)
GFR calc non Af Amer: >60 mL/min (ref >60)
GLUCOSE: 94 mg/dL (ref 65–99)
Potassium: 3.7 mmol/L (ref 3.5–5.1)
Sodium: 140 mmol/L (ref 135–145)

## 2015-02-15 LAB — RAPID URINE DRUG SCREEN, HOSP PERFORMED
Amphetamines: NOT DETECTED
Barbiturates: NOT DETECTED
Benzodiazepines: NOT DETECTED
Cocaine: NOT DETECTED
Opiates: NOT DETECTED
Tetrahydrocannabinol: NOT DETECTED

## 2015-02-15 MED ORDER — TETANUS-DIPHTH-ACELL PERTUSSIS 5-2.5-18.5 LF-MCG/0.5 IM SUSP
0.5000 mL | Freq: Once | INTRAMUSCULAR | Status: AC
  Administered 2015-02-15: 0.5 mL via INTRAMUSCULAR
  Filled 2015-02-15: qty 0.5

## 2015-02-15 MED ORDER — LIDOCAINE HCL 2 % IJ SOLN
10.0000 mL | Freq: Once | INTRAMUSCULAR | Status: AC
  Administered 2015-02-15: 200 mg
  Filled 2015-02-15: qty 20

## 2015-02-15 MED ORDER — HYDROCODONE-ACETAMINOPHEN 5-325 MG PO TABS: 1.0000 | ORAL_TABLET | ORAL | Status: DC | PRN

## 2015-02-15 MED ORDER — BUPIVACAINE HCL 0.5 % IJ SOLN
50.0000 mL | Freq: Once | INTRAMUSCULAR | Status: AC
  Administered 2015-02-15: 50 mL
  Filled 2015-02-15: qty 50

## 2015-02-15 MED ORDER — OXYCODONE-ACETAMINOPHEN 5-325 MG PO TABS
1.0000 | ORAL_TABLET | Freq: Once | ORAL | Status: AC
  Administered 2015-02-15: 1 via ORAL
  Filled 2015-02-15: qty 1

## 2015-02-15 MED ORDER — CEPHALEXIN 500 MG PO CAPS: 500.0000 mg | ORAL_CAPSULE | Freq: Two times a day (BID) | ORAL | Status: DC

## 2015-02-15 MED ORDER — LIDOCAINE HCL (PF) 1 % IJ SOLN
INTRAMUSCULAR | Status: AC
  Filled 2015-02-15: qty 10

## 2015-02-15 MED ORDER — PIPERACILLIN-TAZOBACTAM 3.375 G IVPB 30 MIN
3.3750 g | Freq: Once | INTRAVENOUS | Status: AC
  Administered 2015-02-15: 3.375 g via INTRAVENOUS
  Filled 2015-02-15: qty 50

## 2015-02-15 NOTE — Consult Note (Signed)
NAMESUNG, PARODI NO.:  [id number]  MEDICAL RECORD NO.:  [id number]  LOCATION:  TR10C                        FACILITY:  [facility]  PHYSICIAN:  Sharma Covert IV, M.D.DATE OF BIRTH:  Dec 01, 1990  DATE OF CONSULTATION:  02/15/2015 DATE OF DISCHARGE:  02/15/2015                                CONSULTATION   REQUESTING PHYSICIAN:  Eyvonne Mechanic, PA-C, Emergency Department.  REASON FOR CONSULTATION:  Izick is a 25 year old man presented to the emergency department after sustaining an injury to his right thumb while at work.  The patient was cutting a piece of steel at work when he accidentally cut his right thumb.  The patient cannot remember the date of his last tetanus shot.  The tetanus shot was updated in the emergency department.  His past medical history, past surgical history, medications, allergies, review of systems as documented in the chart.  PHYSICAL EXAMINATION:  GENERAL:  On examination, he is a healthy- appearing male.  Height and weight listed.  Good hand coordination. Normal mood. HEENT:  Head; normocephalic and atraumatic.  Eyes; conjunctivae and extraocular motions normal. NECK:  Supple.  No deformities noted. CARDIOVASCULAR:  Regular radial pulse. PULMONARY AND CHEST:  Good respiratory effort.  No auditory wheezing. EXTREMITIES:  On examination of the right upper extremity, the patient does have the injury noted to the thumb on the pictures to document the extent of the injury.  The patient does have the nail plate avulsion. He is able to flex the thumb IP joint.  He is able to extend his thumb. There was no open wound to the index, long, ring, and small.  He has good wrist flexion, extension as well as forearm rotation.  Good capillary refill.  Good blood flow.  No ascending erythema or lymphangitis.  His radiographs were reviewed and documented in the chart.  PROCEDURE NOTE:  After verbal consent was obtained, the patient elected to  proceed with open repair.  The flexor sheath block was then performed.  The patient tolerated this well.  After flexor sheath block with 1% Xylocaine, the area was prepped and draped in normal sterile fashion.  Time-out was called.  Correct site was identified, and the procedure was then begun.  Attention was then turned to the right thumb and finger.  The tourniquet was then applied.  Hemostasis was then obtained.  The nail plate was then removed with a small Therapist, nutritional. Excisional debridement of the skin and subcutaneous tissue, and bone was then carried out to the open fracture site.  Open debris was then carried out removing the devitalized tissue with sharp scissors. Thorough wound irrigation done throughout.  After open debridement, distal tuft fracture was treated with open method without internal fixation.  Following this, the nail bed was then repaired with 5-0 chromic sutures.  Simple suture was made in the distal portion of the nail matrix.  Once this was carried out, attention was then turned to the traumatic laceration, which was repaired with 4-0 Prolene suture.  A thorough wound irrigation done.  The nail plate had been removed, was then carefully cleaned and placed beneath the knee eponychium.  The finger tourniquet was then removed.  There was good blood flow to the fingertip.  The wound was then irrigated.  Xeroform dressing was then applied.  A sterile compressive bandage was then applied.  The patient tolerated the procedure well.  Small finger splint was then applied to the distal tuft of the finger.  POSTPROCEDURAL PLAN:  The patient was discharged to home, seen back in the office approximately 8 days for likely wound check, tip protector splints, sutures at the 2-week mark, radiographs at the first visit, begin an outpatient therapy regimen, gradual use and activity of the thumb, oral pain medication and oral antibiotics as per the  emergency department.     Madelynn DoneFred W. Jovonte Commins IV, M.D.     FWO/MEDQ  D:  02/15/2015  T:  02/15/2015  Job:  [id number]

## 2015-02-15 NOTE — Discharge Instructions (Signed)
KEEP [facility] CALL OFFICE FOR F/U APPT [phone] in 8 days ALSO CALL FOR THERAPY APPT IN 8 DAYS [phone] EXT 1601 KEEP HAND ELEVATED ABOVE HEART OK TO APPLY ICE TO OPERATIVE AREA CONTACT OFFICE IF ANY WORSENING PAIN OR CONCERNS.

## 2015-02-15 NOTE — ED Notes (Signed)
Per lab without picture ID or supervisor presence the worker comp drug screen cannot be completed. RN spoke with patient who informed this RN that he does not have his ID. States it is locked up at work and he cannot get it tonight because there is no one at work.This RN spoke with the patient's supervisor on the phone about the urine drug screen. Supervisor states he cannot come to the ED and to just disregard the UDS. ED PA aware.

## 2015-02-15 NOTE — ED Notes (Signed)
Pt states that he was at work when a machine cut his rt thumb. Bleeding controled at this time.

## 2015-02-15 NOTE — ED Notes (Signed)
Family at bedside. 

## 2015-02-15 NOTE — Progress Notes (Addendum)
ANTIBIOTIC CONSULT NOTE - INITIAL  Pharmacy Consult for zosyn Indication: open fracture  No Known Allergies  Patient Measurements:   Adjusted Body Weight:   Vital Signs: Temp: 98 F (36.7 C) (01/11 1449) Temp Source: Oral (01/11 1449) BP: 149/90 mmHg (01/11 1449) Pulse Rate: 81 (01/11 1449) Intake/Output from previous day:   Intake/Output from this shift:    Labs: No results for input(s): WBC, HGB, PLT, LABCREA, CREATININE in the last 72 hours. CrCl cannot be calculated (Unknown ideal weight.). No results for input(s): VANCOTROUGH, VANCOPEAK, VANCORANDOM, GENTTROUGH, GENTPEAK, GENTRANDOM, TOBRATROUGH, TOBRAPEAK, TOBRARND, AMIKACINPEAK, AMIKACINTROU, AMIKACIN in the last 72 hours.   Microbiology: No results found for this or any previous visit (from the past 720 hour(s)).  Medical History: Past Medical History  Diagnosis Date  . Pilonidal cyst     Assessment: 25 yo with cut to thumb from machine at work. Pharmacy consulted to dose Zosyn for open fracture. Hand surgery consulted. Afeb, labs pending. No cultures.  1/11 Zosyn>>  Goal of Therapy:  Eradication of infection  Plan:  Zosyn 30 min inf x1 Monitor clinical progress, c/s, renal function, abx plan/LOT F/u SCr for maintenance doses  Babs BertinHaley Baird, PharmD, [facility] Clinical Pharmacist Pager [phone] 02/15/2015 4:57 PM  ADDN: Renal function is wnl. Dose zosyn 3.375g IV q8h.  Arlean Hoppingorey M. Newman PiesBall, PharmD, BCPS Clinical Pharmacist Pager [phone]

## 2015-02-15 NOTE — Consult Note (Signed)
Full note dictated Procedure performed at bedside Pt tolerated

## 2015-02-15 NOTE — Progress Notes (Signed)
Orthopedic Tech Progress Note Patient Details:  Jon Gregory 07/09/1990 [id number]  Ortho Devices Type of Ortho Device: Finger splint Ortho Device/Splint Location: RUE Ortho Device/Splint Interventions: Ordered, Application   [name], [name] 02/15/2015, 7:10 PM

## 2015-02-15 NOTE — ED Provider Notes (Signed)
CSN: [id number]     Arrival date & time 02/15/15  1425 History  By signing my name below, I, Ronney LionSuzanne Le, attest that this documentation has been prepared under the direction and in the presence of Newell RubbermaidJeffrey Mickal Meno, PA-C. Electronically Signed: Ronney LionSuzanne Le, ED Scribe. 02/15/2015. 10:57 PM.    Chief Complaint  Patient presents with  . Finger Injury   The history is provided by the patient. No language interpreter was used.   HPI Comments:  Jon Gregory is a 25 y.o. male with no pertinent medical history, who presents to the Emergency Department complaining of a sudden-onset, constant, unchanged right thumb laceration that onset about 2 hours ago. Patient states he was cutting a piece of steel with a machine at work when he accidentally cut his right thumb. Patient cannot remember the date of his last tetanus vaccination. He has not tried any treatments or medications for his symptoms. He denies a history of any chronic medical conditions. Patient states he last ate at 12 PM, about 3 hours ago.  Past Medical History  Diagnosis Date  . Pilonidal cyst    Past Surgical History  Procedure Laterality Date  . I&d pilonidal cyst     No family history on file. Social History  Substance Use Topics  . Smoking status: Current Every Day Smoker -- 1.00 packs/day    Types: Cigarettes  . Smokeless tobacco: None  . Alcohol Use: Yes     Comment: rarely    Review of Systems A complete 10 system review of systems was obtained and all systems are negative except as noted in the HPI and PMH.    Allergies  Review of patient's allergies indicates no known allergies.  Home Medications   Prior to Admission medications   Medication Sig Start Date End Date Taking? Authorizing Provider  cephALEXin (KEFLEX) 500 MG capsule Take 1 capsule (500 mg total) by mouth 2 (two) times daily. 02/15/15   Eyvonne MechanicJeffrey Bryahna Lesko, PA-C  HYDROcodone-acetaminophen (NORCO/VICODIN) 5-325 MG tablet Take 1 tablet by mouth every 4 (four)  hours as needed. 02/15/15   Eyvonne MechanicJeffrey Kingslee Mairena, PA-C  ibuprofen (ADVIL,MOTRIN) 800 MG tablet Take 1 tablet (800 mg total) by mouth 3 (three) times daily. 01/10/15   Hannah Muthersbaugh, PA-C   BP 142/85 mmHg  Pulse 82  Temp(Src) 98.2 F (36.8 C) (Oral)  Resp 18  SpO2 98%   Physical Exam  Constitutional: He is oriented to person, place, and time. He appears well-developed and well-nourished. No distress.  HENT:  Head: Normocephalic and atraumatic.  Eyes: Conjunctivae and EOM are normal.  Neck: Neck supple. No tracheal deviation present.  Cardiovascular: Normal rate.   Pulmonary/Chest: Effort normal. No respiratory distress.  Musculoskeletal: Normal range of motion.  Neurological: He is alert and oriented to person, place, and time.  Skin: Skin is warm and dry.  Psychiatric: He has a normal mood and affect. His behavior is normal.  Nursing note and vitals reviewed.          ED Course  Procedures (including critical care time)  NERVE BLOCK Performed by: Thermon LeylandHedges,Zebulon Gantt Todd Consent: Verbal consent obtained. Required items: required blood products, implants, devices, and special equipment available Time out: Immediately prior to procedure a "time out" was called to verify the correct patient, procedure, equipment, support staff and site/side marked as required.  Indication: laceration repair Nerve block body site: right first digit  Preparation: Patient was prepped and draped in the usual sterile fashion. Needle gauge: 24 G Location technique: anatomical landmarks  Local anesthetic: bupivacaine, lidocaine  Anesthetic total:  ML bupivacaine, 2 mL lidocaine ml  Outcome: pain improved Patient tolerance: Patient tolerated the procedure well with no immediate complications.  DIAGNOSTIC STUDIES: Oxygen Saturation is 100% on RA, normal by my interpretation.    COORDINATION OF CARE: 10:57 PM - Discussed treatment plan with pt at bedside which includes consult with hand surgeon  on-call (Dr. Melvyn Novas), right hand XR, and pain medication. Pt verbalized understanding and agreed to plan.   Imaging Review Dg Finger Thumb Right  02/15/2015  CLINICAL DATA:  Machine cut thumb at work today.  Pain and bleeding. EXAM: RIGHT THUMB 2+V COMPARISON:  None. FINDINGS: Laceration is seen involving the soft tissues overlying the distal phalanx. A small avulsion fracture fragment is seen from the terminal tuft of the distal phalanx. No other fractures are identified. No evidence of dislocation. IMPRESSION: Small avulsion fracture from the terminal tuft of the distal phalanx. Electronically Signed   By: Myles Rosenthal M.D.   On: 02/15/2015 15:47   I have personally reviewed and evaluated these images and lab results as part of my medical decision-making.  MDM   Final diagnoses:  Finger fracture, open, initial encounter  Laceration   Labs:   Imaging: DG Right Hand- tuft  Consults:  Therapeutics: IV Zosyn, Tdap  Discharge Meds: keflex, Percocet  Assessment/Plan:   25-year-old male presents today with partial amputation of the distal finger. I performed a digital block here, irrigated the wound, orderedIV antibiotics, gave 2. Consult that hand surgery who personally evaluated the patient and performed repair here in the ED. Patient here for Worker's Comp., he did not have his ID as this was locked in his locker at work. He attempted to call his supervisor who would not come to the emergency room as he was done with work and lives a sufficient distance away. 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 Patient has no means to provide sufficient identification today for urinalysis, this is not the patient's fault as he has made reasonable attempts at getting his supervisor to come assist. I realize that urine drug screen done here in the ED is not sufficient, but this does provide information regarding patient's willingness to give a urine drug  screen as he is more than willing to do this.Patient is instructed to follow-up first thing tomorrow morning with his company inform them of today's visit and all relevant data and request immediate urinalysis.atient has follow-up information for Dr. Orlan Leavens, he is given strict return precautions, verbalized understanding and agreement for today's plan.   I personally performed the services described in this documentation, which was scribed in my presence. The recorded information has been reviewed and is accurate.     Eyvonne Mechanic, PA-C 02/15/15 2257  Arby Barrette, MD 02/16/15 [phone]

## 2015-02-20 ENCOUNTER — Emergency Department (HOSPITAL_BASED_OUTPATIENT_CLINIC_OR_DEPARTMENT_OTHER)
Admission: EM | Admit: 2015-02-20 | Discharge: 2015-02-20 | Discharge status: Against Medical Advice | Payer: Worker's Compensation | Attending: Emergency Medicine | Admitting: Emergency Medicine

## 2015-02-20 ENCOUNTER — Encounter (HOSPITAL_BASED_OUTPATIENT_CLINIC_OR_DEPARTMENT_OTHER): Payer: Self-pay | Admitting: *Deleted

## 2015-02-20 DIAGNOSIS — S6991XD Unspecified injury of right wrist, hand and finger(s), subsequent encounter: Secondary | ICD-10-CM | POA: Diagnosis present

## 2015-02-20 DIAGNOSIS — F17210 Nicotine dependence, cigarettes, uncomplicated: Secondary | ICD-10-CM | POA: Diagnosis not present

## 2015-02-20 DIAGNOSIS — X58XXXD Exposure to other specified factors, subsequent encounter: Secondary | ICD-10-CM | POA: Diagnosis not present

## 2015-02-20 NOTE — ED Notes (Signed)
No answer when called for room 

## 2015-02-20 NOTE — ED Notes (Signed)
He ran out of pain medication for right thumb injury. He needs a refill.

## 2015-02-20 NOTE — ED Notes (Signed)
Called x2

## 2015-05-01 ENCOUNTER — Encounter (HOSPITAL_BASED_OUTPATIENT_CLINIC_OR_DEPARTMENT_OTHER): Payer: Self-pay | Admitting: *Deleted

## 2015-05-01 DIAGNOSIS — R112 Nausea with vomiting, unspecified: Secondary | ICD-10-CM | POA: Insufficient documentation

## 2015-05-01 DIAGNOSIS — Z792 Long term (current) use of antibiotics: Secondary | ICD-10-CM | POA: Insufficient documentation

## 2015-05-01 DIAGNOSIS — Z791 Long term (current) use of non-steroidal anti-inflammatories (NSAID): Secondary | ICD-10-CM | POA: Insufficient documentation

## 2015-05-01 DIAGNOSIS — Z872 Personal history of diseases of the skin and subcutaneous tissue: Secondary | ICD-10-CM | POA: Insufficient documentation

## 2015-05-01 DIAGNOSIS — F17210 Nicotine dependence, cigarettes, uncomplicated: Secondary | ICD-10-CM | POA: Insufficient documentation

## 2015-05-01 NOTE — ED Notes (Signed)
Nausea x 2 days. [facility]

## 2015-05-02 ENCOUNTER — Emergency Department (HOSPITAL_BASED_OUTPATIENT_CLINIC_OR_DEPARTMENT_OTHER)
Admission: EM | Admit: 2015-05-02 | Discharge: 2015-05-02 | Disposition: A | Discharge status: Home / Self Care | Payer: No Typology Code available for payment source | Attending: Emergency Medicine | Admitting: Emergency Medicine

## 2015-05-02 DIAGNOSIS — R112 Nausea with vomiting, unspecified: Secondary | ICD-10-CM

## 2015-05-02 MED ORDER — ONDANSETRON 8 MG PO TBDP
8.0000 mg | ORAL_TABLET | Freq: Once | ORAL | Status: AC
  Administered 2015-05-02: 8 mg via ORAL
  Filled 2015-05-02: qty 1

## 2015-05-02 MED ORDER — PROMETHAZINE HCL 25 MG PO TABS: 25.0000 mg | ORAL_TABLET | Freq: Four times a day (QID) | ORAL | Status: AC | PRN

## 2015-05-02 NOTE — ED Provider Notes (Signed)
CSN: [id number]     Arrival date & time 05/01/15  2139 History   First MD Initiated Contact with Patient 05/02/15 0153     Chief Complaint  Patient presents with  . Nausea     (Consider location/radiation/quality/duration/timing/severity/associated sxs/prior Treatment) HPI  This is a 25 year old male with a two-day history of nausea and vomiting. He was seen at [facility] 2 days ago and was given IV fluids and Zofran. He was given a prescription for Zofran but he could not afford to get it filled. He last vomited about 10 AM yesterday. He has been able to eat and drink since then. He denies diarrhea. He did have some abdominal discomfort and subjective fever yesterday. He was not febrile on arrival and is not having abdominal pain currently.  Past Medical History  Diagnosis Date  . Pilonidal cyst    Past Surgical History  Procedure Laterality Date  . I&d pilonidal cyst     No family history on file. Social History  Substance Use Topics  . Smoking status: Current Every Day Smoker -- 1.00 packs/day    Types: Cigarettes  . Smokeless tobacco: None  . Alcohol Use: Yes     Comment: rarely    Review of Systems  All other systems reviewed and are negative.   Allergies  Review of patient's allergies indicates no known allergies.  Home Medications   Prior to Admission medications   Medication Sig Start Date End Date Taking? Authorizing Provider  cephALEXin (KEFLEX) 500 MG capsule Take 1 capsule (500 mg total) by mouth 2 (two) times daily. 02/15/15   Eyvonne MechanicJeffrey Hedges, PA-C  HYDROcodone-acetaminophen (NORCO/VICODIN) 5-325 MG tablet Take 1 tablet by mouth every 4 (four) hours as needed. 02/15/15   Eyvonne MechanicJeffrey Hedges, PA-C  ibuprofen (ADVIL,MOTRIN) 800 MG tablet Take 1 tablet (800 mg total) by mouth 3 (three) times daily. 01/10/15   Hannah Muthersbaugh, PA-C   BP 128/78 mmHg  Pulse 80  Temp(Src) 98.6 F (37 C) (Oral)  Resp 18  Ht 5\' 8"  (1.727 m)  Wt 220 lb (99.791 kg)  BMI  33.46 kg/m2  SpO2 100%   Physical Exam  General: Well-developed, well-nourished male in no acute distress; appearance consistent with age of record HENT: normocephalic; atraumatic Eyes: pupils equal, round and reactive to light; extraocular muscles intact Neck: supple Heart: regular rate and rhythm Lungs: clear to auscultation bilaterally Abdomen: soft; nondistended; nontender; no masses or hepatosplenomegaly; bowel sounds present Extremities: No deformity; full range of motion; pulses normal Neurologic: Awake, alert and oriented; motor function intact in all extremities and symmetric; no facial droop Skin: Warm and dry Psychiatric: Normal mood and affect    ED Course  Procedures (including critical care time)   MDM     Jon LibraJohn Daelon Dunivan, MD 05/02/15 [phone]

## 2015-05-02 NOTE — ED Notes (Signed)
Pt was seen yesterday at [facility] and given prescription for zofran which he did not get filled because it was too expensive.  Pt is still c/o nausea and general stomach upset, but has been able to hold down PO fluids without issue.  Last episode of emesis was this morning.

## 2015-05-02 NOTE — ED Notes (Signed)
Pt verbalized to MD that he was able to eat fruit snacks and doritos without issue

## 2015-05-02 NOTE — ED Notes (Signed)
Pt verbalizes understanding of d/c instructions any further needs at this time.

## 2016-12-25 IMAGING — CR DG FINGER THUMB 2+V*R*
3 series · 3 of 3 positions shown · non-contrast
Comparison: None.

CLINICAL DATA: Machine cut thumb at work today.  Pain and bleeding.

EXAM:
RIGHT THUMB 2+V

[finger ap]
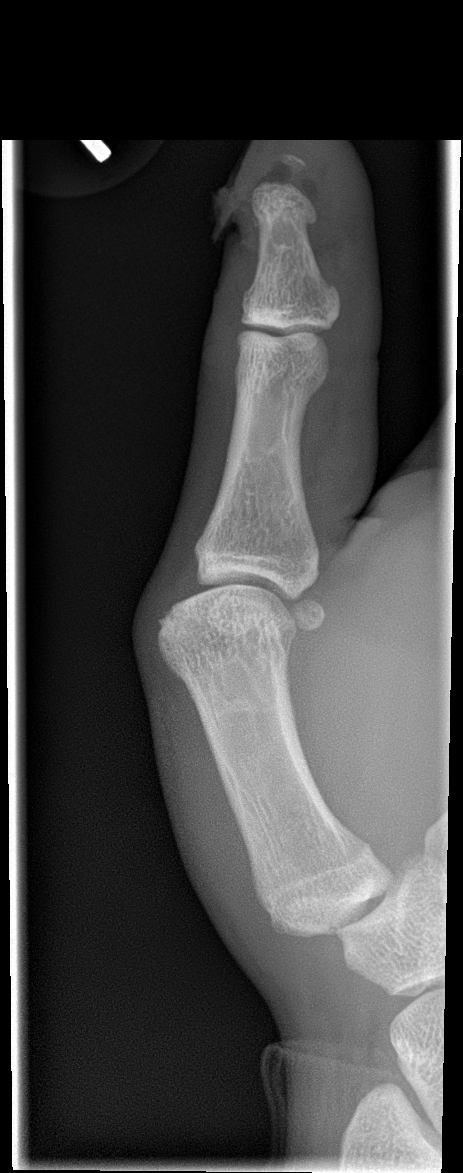

[finger obl]
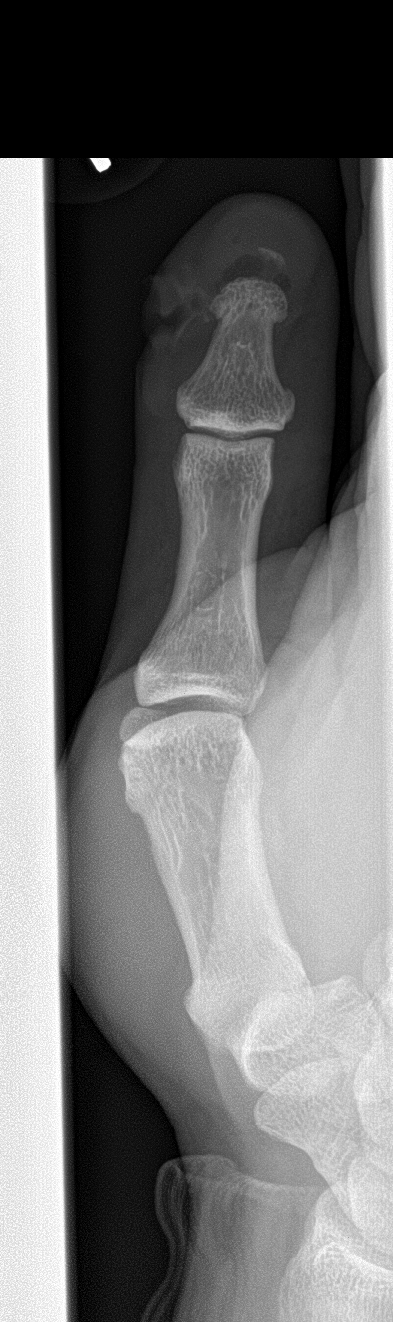

[finger lat]
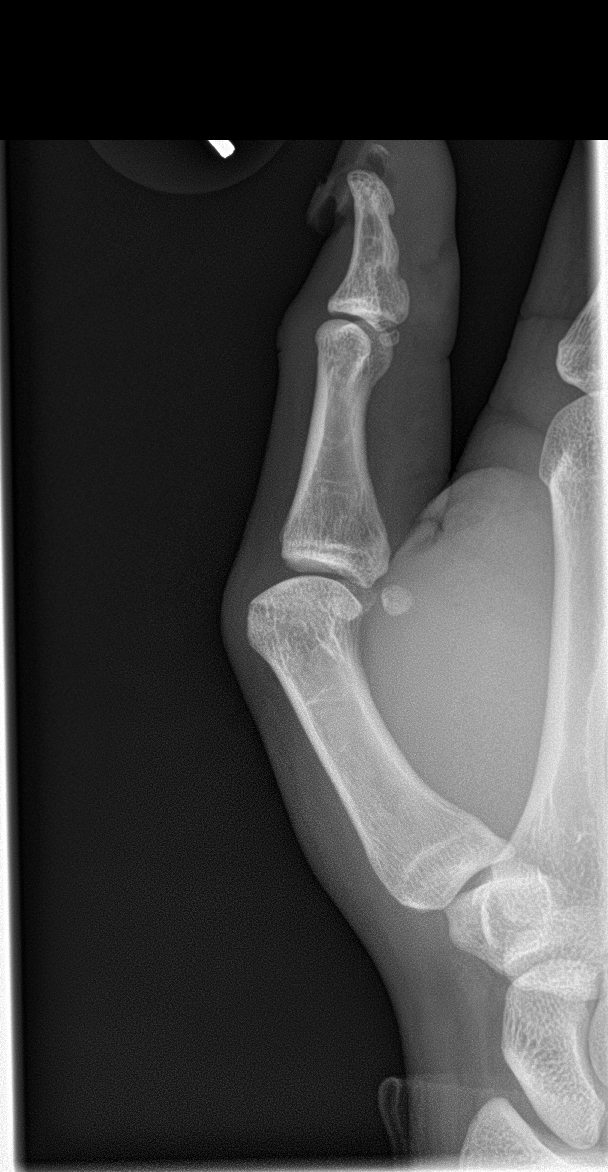

[3 of 3 positions shown; findings below may reference images not displayed]

FINDINGS: Laceration is seen involving the soft tissues overlying the distal
phalanx. A small avulsion fracture fragment is seen from the
terminal tuft of the distal phalanx. No other fractures are
identified. No evidence of dislocation.
IMPRESSION: Small avulsion fracture from the terminal tuft of the distal
phalanx.
